# Patient Record
Sex: Female | Born: 1935 | Race: White | Hispanic: No | Smoking: Never smoker
Health system: Southern US, Community
[De-identification: ages and names within clinical notes are randomized; demographics above are authoritative.]

## PROBLEM LIST (undated history)

## (undated) DIAGNOSIS — H409 Unspecified glaucoma: Secondary | ICD-10-CM

## (undated) DIAGNOSIS — I509 Heart failure, unspecified: Secondary | ICD-10-CM

## (undated) DIAGNOSIS — K219 Gastro-esophageal reflux disease without esophagitis: Secondary | ICD-10-CM

## (undated) DIAGNOSIS — E559 Vitamin D deficiency, unspecified: Secondary | ICD-10-CM

## (undated) DIAGNOSIS — D649 Anemia, unspecified: Secondary | ICD-10-CM

## (undated) DIAGNOSIS — G4700 Insomnia, unspecified: Secondary | ICD-10-CM

## (undated) HISTORY — DX: Heart failure, unspecified: I50.9

## (undated) HISTORY — DX: Unspecified glaucoma: H40.9

## (undated) HISTORY — DX: Insomnia, unspecified: G47.00

## (undated) HISTORY — DX: Anemia, unspecified: D64.9

## (undated) HISTORY — DX: Gastro-esophageal reflux disease without esophagitis: K21.9

## (undated) HISTORY — DX: Vitamin D deficiency, unspecified: E55.9

---

## 2013-07-13 ENCOUNTER — Encounter (HOSPITAL_COMMUNITY): Payer: Self-pay | Admitting: Emergency Medicine

## 2013-07-13 ENCOUNTER — Observation Stay (HOSPITAL_COMMUNITY)
Admission: EM | Admit: 2013-07-13 | Discharge: 2013-07-14 | Disposition: A | Discharge status: Home / Self Care | Payer: Medicare Other | Attending: Internal Medicine | Admitting: Internal Medicine

## 2013-07-13 ENCOUNTER — Emergency Department (HOSPITAL_COMMUNITY): Payer: Medicare Other

## 2013-07-13 DIAGNOSIS — K219 Gastro-esophageal reflux disease without esophagitis: Secondary | ICD-10-CM

## 2013-07-13 DIAGNOSIS — I4891 Unspecified atrial fibrillation: Secondary | ICD-10-CM | POA: Insufficient documentation

## 2013-07-13 DIAGNOSIS — H9190 Unspecified hearing loss, unspecified ear: Secondary | ICD-10-CM | POA: Insufficient documentation

## 2013-07-13 DIAGNOSIS — E871 Hypo-osmolality and hyponatremia: Secondary | ICD-10-CM | POA: Insufficient documentation

## 2013-07-13 DIAGNOSIS — Z23 Encounter for immunization: Secondary | ICD-10-CM | POA: Insufficient documentation

## 2013-07-13 DIAGNOSIS — Z79899 Other long term (current) drug therapy: Secondary | ICD-10-CM | POA: Insufficient documentation

## 2013-07-13 DIAGNOSIS — R079 Chest pain, unspecified: Secondary | ICD-10-CM

## 2013-07-13 DIAGNOSIS — R0789 Other chest pain: Principal | ICD-10-CM | POA: Insufficient documentation

## 2013-07-13 LAB — CBC WITH DIFFERENTIAL/PLATELET
Basophils Absolute: 0 10*3/uL (ref 0.0–0.1)
HCT: 31.6 % — ABNORMAL LOW (ref 36.0–46.0)
Hemoglobin: 11.2 g/dL — ABNORMAL LOW (ref 12.0–15.0)
Lymphocytes Relative: 27 % (ref 12–46)
MCHC: 35.4 g/dL (ref 30.0–36.0)
Monocytes Absolute: 0.8 10*3/uL (ref 0.1–1.0)
Monocytes Relative: 17 % — ABNORMAL HIGH (ref 3–12)
Neutro Abs: 2.5 10*3/uL (ref 1.7–7.7)
Neutrophils Relative %: 53 % (ref 43–77)
Platelets: 145 10*3/uL — ABNORMAL LOW (ref 150–400)
RDW: 12.3 % (ref 11.5–15.5)
WBC: 4.7 10*3/uL (ref 4.0–10.5)

## 2013-07-13 LAB — BASIC METABOLIC PANEL
CO2: 25 mEq/L (ref 19–32)
Chloride: 95 mEq/L — ABNORMAL LOW (ref 96–112)
Creatinine, Ser: 0.51 mg/dL (ref 0.50–1.10)
GFR calc Af Amer: >90 mL/min (ref >90)
Potassium: 3.7 mEq/L (ref 3.5–5.1)

## 2013-07-13 LAB — CBC
HCT: 29.7 % — ABNORMAL LOW (ref 36.0–46.0)
Hemoglobin: 10.4 g/dL — ABNORMAL LOW (ref 12.0–15.0)
MCHC: 35 g/dL (ref 30.0–36.0)
MCV: 92.5 fL (ref 78.0–100.0)
Platelets: 146 10*3/uL — ABNORMAL LOW (ref 150–400)
RDW: 12.5 % (ref 11.5–15.5)
WBC: 3.1 10*3/uL — ABNORMAL LOW (ref 4.0–10.5)

## 2013-07-13 LAB — CREATININE, SERUM: GFR calc Af Amer: 84 mL/min — ABNORMAL LOW (ref >90)

## 2013-07-13 LAB — TROPONIN I: Troponin I: <0.3 ng/mL (ref <0.30)

## 2013-07-13 LAB — DIGOXIN LEVEL: Digoxin Level: 0.3 ng/mL — ABNORMAL LOW (ref 0.8–2.0)

## 2013-07-13 LAB — POCT I-STAT TROPONIN I

## 2013-07-13 MED ORDER — SODIUM CHLORIDE 0.9 % IV SOLN
INTRAVENOUS | Status: DC
  Administered 2013-07-13 – 2013-07-14 (×2): via INTRAVENOUS

## 2013-07-13 MED ORDER — CARVEDILOL 6.25 MG PO TABS
6.2500 mg | ORAL_TABLET | Freq: Two times a day (BID) | ORAL | Status: DC
  Administered 2013-07-13 – 2013-07-14 (×2): 6.25 mg via ORAL
  Filled 2013-07-13 (×4): qty 1

## 2013-07-13 MED ORDER — ASPIRIN 325 MG PO TABS
325.0000 mg | ORAL_TABLET | Freq: Every day | ORAL | Status: DC
  Administered 2013-07-13 – 2013-07-14 (×2): 325 mg via ORAL
  Filled 2013-07-13 (×2): qty 1

## 2013-07-13 MED ORDER — ALUM & MAG HYDROXIDE-SIMETH 200-200-20 MG/5ML PO SUSP: 30.0000 mL | Freq: Four times a day (QID) | ORAL | Status: DC | PRN

## 2013-07-13 MED ORDER — NITROGLYCERIN 0.4 MG SL SUBL: 0.4000 mg | SUBLINGUAL_TABLET | SUBLINGUAL | Status: DC | PRN

## 2013-07-13 MED ORDER — LATANOPROST 0.005 % OP SOLN
1.0000 [drp] | Freq: Every day | OPHTHALMIC | Status: DC
  Administered 2013-07-13: 1 [drp] via OPHTHALMIC
  Filled 2013-07-13 (×2): qty 2.5

## 2013-07-13 MED ORDER — ENOXAPARIN SODIUM 30 MG/0.3ML ~~LOC~~ SOLN
30.0000 mg | SUBCUTANEOUS | Status: DC
  Administered 2013-07-13: 30 mg via SUBCUTANEOUS
  Filled 2013-07-13 (×2): qty 0.3

## 2013-07-13 MED ORDER — DIGOXIN 125 MCG PO TABS
0.1250 mg | ORAL_TABLET | Freq: Every day | ORAL | Status: DC
  Administered 2013-07-13 – 2013-07-14 (×2): 0.125 mg via ORAL
  Filled 2013-07-13 (×2): qty 1

## 2013-07-13 MED ORDER — INFLUENZA VAC SPLIT QUAD 0.5 ML IM SUSP
0.5000 mL | INTRAMUSCULAR | Status: AC
  Administered 2013-07-14: 0.5 mL via INTRAMUSCULAR
  Filled 2013-07-13: qty 0.5

## 2013-07-13 MED ORDER — ACETAMINOPHEN 325 MG PO TABS
650.0000 mg | ORAL_TABLET | Freq: Four times a day (QID) | ORAL | Status: DC | PRN
  Administered 2013-07-13: 650 mg via ORAL
  Filled 2013-07-13: qty 2

## 2013-07-13 MED ORDER — ONDANSETRON HCL 4 MG/2ML IJ SOLN: 4.0000 mg | Freq: Four times a day (QID) | INTRAMUSCULAR | Status: DC | PRN

## 2013-07-13 MED ORDER — BISACODYL 5 MG PO TBEC
5.0000 mg | DELAYED_RELEASE_TABLET | Freq: Every day | ORAL | Status: DC | PRN
  Filled 2013-07-13: qty 1

## 2013-07-13 MED ORDER — PANTOPRAZOLE SODIUM 40 MG PO TBEC
40.0000 mg | DELAYED_RELEASE_TABLET | Freq: Every day | ORAL | Status: DC
  Administered 2013-07-13 – 2013-07-14 (×2): 40 mg via ORAL
  Filled 2013-07-13 (×2): qty 1

## 2013-07-13 MED ORDER — SODIUM CHLORIDE 0.9 % IJ SOLN: 3.0000 mL | Freq: Two times a day (BID) | INTRAMUSCULAR | Status: DC

## 2013-07-13 MED ORDER — ACETAMINOPHEN 650 MG RE SUPP: 650.0000 mg | Freq: Four times a day (QID) | RECTAL | Status: DC | PRN

## 2013-07-13 MED ORDER — ONDANSETRON HCL 4 MG PO TABS: 4.0000 mg | ORAL_TABLET | Freq: Four times a day (QID) | ORAL | Status: DC | PRN

## 2013-07-13 MED ORDER — METOPROLOL TARTRATE 1 MG/ML IV SOLN
2.5000 mg | Freq: Once | INTRAVENOUS | Status: AC
  Administered 2013-07-13: 2.5 mg via INTRAVENOUS
  Filled 2013-07-13: qty 5

## 2013-07-13 NOTE — Progress Notes (Signed)
Pt converted to SR at 10:46.

## 2013-07-13 NOTE — ED Provider Notes (Signed)
CSN: [id number]     Arrival date & time 07/13/13  0247 History   First MD Initiated Contact with Patient 07/13/13 0255     Chief Complaint  Patient presents with  . Chest Pain   (Consider location/radiation/quality/duration/timing/severity/associated sxs/prior Treatment) HPI 79 yo female presents to the ER from home via EMS with complaint of chest pressure that woke her from sleep tonight.  Pt noted to have afib with RVR upon arrival, she and grandson deny h/o same.  Pt is very hard of hearing, difficult historian.  Pt lives on her own.  She has a cardiologist in [place], but does not know why she sees him.  No pain at present.  She denies palpitations despite current HR (130s)  She took an old NTG which seemed to help pain. EMS gave aspirin in route.  She felt better with NS flush per EMS.   History reviewed. No pertinent past medical history. History reviewed. No pertinent past surgical history. No family history on file. History  Substance Use Topics  . Smoking status: Not on file  . Smokeless tobacco: Not on file  . Alcohol Use: Not on file   OB History   Grav Para Term Preterm Abortions TAB SAB Ect Mult Living                 Review of Systems  Unable to perform ROS: Other  pt HOH  Allergies  Review of patient's allergies indicates no known allergies.  Home Medications   Current Outpatient Rx  Name  Route  Sig  Dispense  Refill  . carvedilol (COREG) 6.25 MG tablet   Oral   Take 6.25 mg by mouth 2 (two) times daily with a meal.         . digoxin (LANOXIN) 0.125 MG tablet   Oral   Take 0.125 mg by mouth daily.         Marland Kitchen loratadine (CLARITIN) 10 MG tablet   Oral   Take 10 mg by mouth daily.         . mirabegron ER (MYRBETRIQ) 25 MG TB24 tablet   Oral   Take 25 mg by mouth daily.         . nitroGLYCERIN (NITROSTAT) 0.4 MG SL tablet   Sublingual   Place 0.4 mg under the tongue every 5 (five) minutes as needed for chest pain.         Marland Kitchen OVER THE COUNTER  MEDICATION   Oral   Take 1 tablet by mouth daily. PreserVision         . pantoprazole (PROTONIX) 40 MG tablet   Oral   Take 40 mg by mouth daily.         . Travoprost, BAK Free, (TRAVATAN) 0.004 % SOLN ophthalmic solution   Both Eyes   Place 1 drop into both eyes at bedtime.          BP 98/58  Pulse 37  Temp(Src) 98.2 F (36.8 C) (Oral)  Resp 25  SpO2 95% Physical Exam  Constitutional: She is oriented to person, place, and time. No distress.  Frail elderly female in NAD  HENT:  Head: Normocephalic and atraumatic.  Nose: Nose normal.  Mouth/Throat: Oropharynx is clear and moist.  Eyes: Pupils are equal, round, and reactive to light.  Right pupil irregular  Neck: Normal range of motion. Neck supple. No JVD present. No tracheal deviation present. No thyromegaly present.  Cardiovascular:  Irregular rate, rhythm  Pulmonary/Chest: Effort normal and breath sounds  normal. No stridor. No respiratory distress. She has no wheezes. She has no rales. She exhibits no tenderness.  Abdominal: Soft. Bowel sounds are normal. She exhibits no distension and no mass. There is no tenderness. There is no rebound and no guarding.  Musculoskeletal: Normal range of motion. She exhibits no edema and no tenderness.  Lymphadenopathy:    She has no cervical adenopathy.  Neurological: She is alert and oriented to person, place, and time. She exhibits normal muscle tone. Coordination normal.  Skin: Skin is warm and dry. No rash noted. No erythema. No pallor.    ED Course  Procedures (including critical care time)  CRITICAL CARE Performed by: Olivia Mackie Total critical care time: 30 min Critical care time was exclusive of separately billable procedures and treating other patients. Critical care was necessary to treat or prevent imminent or life-threatening deterioration. Critical care was time spent personally by me on the following activities: development of treatment plan with patient and/or  surrogate as well as nursing, discussions with consultants, evaluation of patient's response to treatment, examination of patient, obtaining history from patient or surrogate, ordering and performing treatments and interventions, ordering and review of laboratory studies, ordering and review of radiographic studies, pulse oximetry and re-evaluation of patient's condition.  Labs Review Labs Reviewed  CBC WITH DIFFERENTIAL - Abnormal; Notable for the following:    RBC 3.41 (*)    Hemoglobin 11.2 (*)    HCT 31.6 (*)    Platelets 145 (*)    Monocytes Relative 17 (*)    All other components within normal limits  BASIC METABOLIC PANEL - Abnormal; Notable for the following:    Sodium 127 (*)    Chloride 95 (*)    Glucose, Bld 105 (*)    GFR calc non Af Amer 78 (*)    All other components within normal limits  DIGOXIN LEVEL - Abnormal; Notable for the following:    Digoxin Level 0.3 (*)    All other components within normal limits   Imaging Review Dg Chest 2 View  07/13/2013   CLINICAL DATA:  Chest pain  EXAM: CHEST  2 VIEW  COMPARISON:  08/21/2012  FINDINGS: Chronic cardiomegaly. Mediastinal contours distorted by leftward rotation. Hyperinflated lungs with chronic interstitial coarsening. Streaky lower lung opacities, especially at the right base, appears similar to prior. Left costophrenic sulcus blunting is likely chronic. No suspected pulmonary edema.  IMPRESSION: No definite acute cardiopulmonary disease. There are chronic changes which could obscure early infection.   Electronically Signed   By: Tiburcio Pea M.D.   On: 07/13/2013 03:36    EKG Interpretation     Ventricular Rate:  128 PR Interval:    QRS Duration: 87 QT Interval:  339 QTC Calculation: 495 R Axis:   -21 Text Interpretation:  Atrial fibrillation Multiple ventricular premature complexes Borderline left axis deviation Abnormal R-wave progression, early transition Borderline repolarization abnormality No old tracing to  compare            MDM   1. Atrial fibrillation with RVR   2. Chest pain   3. Hyponatremia    79 yo female with chest pain tonight, in afib with rvr currently.  No prior records in our system.  Records requested from [facility], ED visit from 12/13 sent.  No mention of afib, cardiac history.  Sodium on that visit 130.  D/w hospitalist for admission for chest pain.  Afib improved from 130s to 100s with metoprolol.    Olivia Mackie,  MD 07/13/13 1610

## 2013-07-13 NOTE — H&P (Signed)
[facility] History and Physical  DAMARIZ PAGANELLI UJW:[id number] DOB: [birth year] DOA: 07/13/2013  Referring physician:  PCP: No primary provider on file.  Specialists:   Chief Complaint:   HPI: Tanya Russell is a 79 y.o. female with a past medical history of atrial fibrillation, presently on digoxin 0.125 mg by mouth daily and Coreg 6.25 mg by mouth daily, who was in her usual state health, went to bed last night at approximately 10 PM, will go up at 1:30 having palpitations, feeling "heart racing" with associated dizziness and lightheadedness. Because the symptoms persisted she called EMS and was brought to the emergent apartment where she was found to be in A. fib with rapid ventricular response having ventricular rates in the 130s. She was given 2.5 mg IV of metoprolol with subsequent improvement to her heart rates. Since then she has been rate controlled with ventricular rates in the 80s. Patient denies having chest pain however I spoke to her grandson who informed me that she might have complained of chest discomfort overnight while this was happening. Otherwise patient reports feeling much better and back to her "normal self." I discussed case with her cardiologist Dr Bing Matter who recommended continuing her present home regimen of digoxin and carvedilol, adding aspirin 325 mg by mouth daily for stroke prophylaxis.                                            Review of Systems: The patient denies anorexia, fever, weight loss,, vision loss, decreased hearing, hoarseness, syncope, dyspnea on exertion, peripheral edema, balance deficits, hemoptysis, abdominal pain, melena, hematochezia, severe indigestion/heartburn, hematuria, incontinence, genital sores, muscle weakness, suspicious skin lesions, transient blindness, difficulty walking, depression, unusual weight change, abnormal bleeding, enlarged lymph nodes, angioedema, and breast masses.    History reviewed. No pertinent past medical  history. History reviewed. No pertinent past surgical history. Social History:  has no tobacco, alcohol, and drug history on file. Patient currently resides independently in the community. She does not drink or smoke. At baseline she is independent on all activities of daily living  No Known Allergies  No family history on file. noncontributory  Prior to Admission medications   Medication Sig Start Date End Date Taking? Authorizing Provider  carvedilol (COREG) 6.25 MG tablet Take 6.25 mg by mouth 2 (two) times daily with a meal.   Yes Historical Provider, MD  digoxin (LANOXIN) 0.125 MG tablet Take 0.125 mg by mouth daily.   Yes Historical Provider, MD  loratadine (CLARITIN) 10 MG tablet Take 10 mg by mouth daily.   Yes Historical Provider, MD  mirabegron ER (MYRBETRIQ) 25 MG TB24 tablet Take 25 mg by mouth daily.   Yes Historical Provider, MD  nitroGLYCERIN (NITROSTAT) 0.4 MG SL tablet Place 0.4 mg under the tongue every 5 (five) minutes as needed for chest pain.   Yes Historical Provider, MD  OVER THE COUNTER MEDICATION Take 1 tablet by mouth daily. PreserVision   Yes Historical Provider, MD  pantoprazole (PROTONIX) 40 MG tablet Take 40 mg by mouth daily.   Yes Historical Provider, MD  Travoprost, BAK Free, (TRAVATAN) 0.004 % SOLN ophthalmic solution Place 1 drop into both eyes at bedtime.   Yes Historical Provider, MD   Physical Exam: Filed Vitals:   07/13/13 0835  BP: 116/52  Pulse: 89  Temp: 97.5 F (36.4 C)  Resp: 15  General:  Patient is in no acute distress, reports feeling better, requesting a meal tray  Eyes: Pupils are equal round reactive to light extraocular movement is intact no sclera icterus  Neck: Neck supple symmetrical no jugular venous distention or carotid bruit  Cardiovascular: Irregular rate and rhythm normal S1-S2 no murmurs rubs or gallops no extremity edema  Respiratory: Lungs overall clear to auscultation bilaterally no wheezing rhonchi or  rales  Abdomen: Soft nontender nondistended positive bowel sounds  Skin: No rashes or lesions  Musculoskeletal: No edema noted, preserved range of motion of all extremities  Psychiatric: Patient is awake alert oriented x3, hard hearing  Neurologic: Cranial nerves 2-12 are grossly intact no alteration to sensation global 5 out of 5 muscle strength  Labs on Admission:  Basic Metabolic Panel:  Recent Labs Lab 07/13/13 0342  NA 127*  K 3.7  CL 95*  CO2 25  GLUCOSE 105*  BUN 14  CREATININE 0.51  CALCIUM 8.5   Liver Function Tests: No results found for this basename: AST, ALT, ALKPHOS, BILITOT, PROT, ALBUMIN,  in the last 168 hours No results found for this basename: LIPASE, AMYLASE,  in the last 168 hours No results found for this basename: AMMONIA,  in the last 168 hours CBC:  Recent Labs Lab 07/13/13 0342  WBC 4.7  NEUTROABS 2.5  HGB 11.2*  HCT 31.6*  MCV 92.7  PLT 145*   Cardiac Enzymes: No results found for this basename: CKTOTAL, CKMB, CKMBINDEX, TROPONINI,  in the last 168 hours  BNP (last 3 results) No results found for this basename: PROBNP,  in the last 8760 hours CBG: No results found for this basename: GLUCAP,  in the last 168 hours  Radiological Exams on Admission: Dg Chest 2 View  07/13/2013   CLINICAL DATA:  Chest pain  EXAM: CHEST  2 VIEW  COMPARISON:  08/21/2012  FINDINGS: Chronic cardiomegaly. Mediastinal contours distorted by leftward rotation. Hyperinflated lungs with chronic interstitial coarsening. Streaky lower lung opacities, especially at the right base, appears similar to prior. Left costophrenic sulcus blunting is likely chronic. No suspected pulmonary edema.  IMPRESSION: No definite acute cardiopulmonary disease. There are chronic changes which could obscure early infection.   Electronically Signed   By: Tiburcio Pea M.D.   On: 07/13/2013 03:36    EKG: Independently reviewed. Atrial fibrillation  Assessment/Plan Active Problems:    Atrial fibrillation with rapid ventricular response   Chest pain, unspecified   Hyponatremia   GERD (gastroesophageal reflux disease)   1. Atrial fibrillation with rapid ventricular response. Patient presenting with a fair with RVR, having ventricular rates in the 130s. After the administration of metoprolol 2.5 mg IV, ventricular rate improved into the 80s. I discussed case with her cardiologist Dr Bing Matter, who recommended continuing the current dose of digoxin at 0.25 mcg by mouth daily and Coreg 6.25 mg by mouth twice daily. He also recommended starting aspirin 325 mg by mouth daily. Patient had a transthoracic echocardiogram done in his office in 2013 which showed a preserved ejection fraction . Will admit patient to August, placed on continuous cardiac monitoring and cycle cardiac enzymes.  2. Chest pain. Although patient reports having chest pain, grandson feels that she may have complained of chest pain overnight. This likely resulted from A. fib with RVR. Will monitor her cardiac enzymes today. 3. Hyponatremia. Lab work showing a sodium of 127. Could be secondary to hypovolemia, will start gentle IV fluid hydration with normal saline at 75 ML's per hour. Repeat  BMP in a.m. 4. Gastroesophageal reflux disease. Protonix 40 mg by mouth daily   Code Status: Spoke with patient's grandson, patient is a DO NOT RESUSCITATE Family Communication:  I updated patient's grandson Molly Maduro over telephone Disposition Plan: Placing patient in overnight OBS DO not anticipate greater than 2 night hospitalization    Time spent: 76  Meah Jiron [facility] Pager [phone]  If 7PM-7AM, please contact night-coverage [email or web address] Password [email or web address] 07/13/2013, 11:01 AM

## 2013-07-13 NOTE — Progress Notes (Signed)
Utilization review completed.  

## 2013-07-13 NOTE — ED Notes (Signed)
PER EMS: pt from home, reports non-radiating CP "it feels like my heart is not beating right." Took nitro "that ive had forever" prior to EMS arrival. EMS reports pt was in A-fib when they arrived. EMS 18g RAC, flushed with 10cc normal saline and pt states "that shot you gave me made me feel better." Pt denies pain at this moment. BP- 112/64, Hr-102 Irregular, RR-19, 95% RA. Pt very heard of hearing.

## 2013-07-14 DIAGNOSIS — R079 Chest pain, unspecified: Secondary | ICD-10-CM

## 2013-07-14 DIAGNOSIS — I4891 Unspecified atrial fibrillation: Secondary | ICD-10-CM

## 2013-07-14 LAB — BASIC METABOLIC PANEL
CO2: 26 mEq/L (ref 19–32)
Chloride: 104 mEq/L (ref 96–112)
GFR calc non Af Amer: 79 mL/min — ABNORMAL LOW (ref >90)
Glucose, Bld: 83 mg/dL (ref 70–99)
Potassium: 3.4 mEq/L — ABNORMAL LOW (ref 3.5–5.1)
Sodium: 135 mEq/L (ref 135–145)

## 2013-07-14 LAB — CBC
HCT: 28.3 % — ABNORMAL LOW (ref 36.0–46.0)
Hemoglobin: 10.2 g/dL — ABNORMAL LOW (ref 12.0–15.0)
MCH: 33.3 pg (ref 26.0–34.0)
MCV: 92.5 fL (ref 78.0–100.0)
RBC: 3.06 MIL/uL — ABNORMAL LOW (ref 3.87–5.11)

## 2013-07-14 MED ORDER — POTASSIUM CHLORIDE CRYS ER 20 MEQ PO TBCR
40.0000 meq | EXTENDED_RELEASE_TABLET | Freq: Once | ORAL | Status: AC
  Administered 2013-07-14: 40 meq via ORAL
  Filled 2013-07-14: qty 2

## 2013-07-14 MED ORDER — ASPIRIN 325 MG PO TABS: 325.0000 mg | ORAL_TABLET | Freq: Every day | ORAL | Status: AC

## 2013-07-14 NOTE — Progress Notes (Signed)
Pt grandson provided with dc instructions and education. No concerns at this time. IV removed with tip intact. Heart monitor cleaned and returned to frotn. Pt dc home with grandson. Levonne Spiller, RN

## 2013-07-14 NOTE — Evaluation (Addendum)
Physical Therapy Evaluation Patient Details Name: Tanya Russell MRN: [id number] DOB: Jan 17, 1914 Today's Date: 07/14/2013 Time: 9629-5284 PT Time Calculation (min): 24 min  PT Assessment / Plan / Recommendation History of Present Illness  Pt adm with a-fib with RVR.  Clinical Impression  Pt presents to PT with fall risk with ambulation but has been managing at home.  Cluttered home environment makes use of rolling walker impractical. Pt for dc home and recommend HHPT to further assess home safety and mobility.     PT Assessment  All further PT needs can be met in the next venue of care    Follow Up Recommendations  Home health PT    Does the patient have the potential to tolerate intense rehabilitation      Barriers to Discharge        Equipment Recommendations  None recommended by PT    Recommendations for Other Services     Frequency      Precautions / Restrictions Precautions Precautions: Fall   Pertinent Vitals/Pain VSS      Mobility  Transfers Transfers: Sit to Stand;Stand to Sit Sit to Stand: 6: Modified independent (Device/Increase time);With upper extremity assist;From bed;From toilet Stand to Sit: 6: Modified independent (Device/Increase time);With upper extremity assist;To bed;To toilet Ambulation/Gait Ambulation/Gait Assistance: 5: Supervision Ambulation Distance (Feet): 100 Feet Assistive device: Large base quad cane;Rolling walker Ambulation/Gait Assistance Details: When using quad cane pt reaching and holding onto objects with other hand.  Pt steadier with rolling walker but unable to use in house due to clutter. Gait Pattern: Step-through pattern;Decreased step length - right;Decreased step length - left;Trunk flexed Gait velocity: slow    Exercises     PT Diagnosis: Difficulty walking  PT Problem List: Decreased balance;Decreased mobility;Decreased knowledge of use of DME PT Treatment Interventions:       PT Goals(Current goals can be  found in the care plan section) Acute Rehab PT Goals Patient Stated Goal: Go home PT Goal Formulation: No goals set, d/c therapy  Visit Information  Last PT Received On: 07/14/13 Assistance Needed: +1 History of Present Illness: Pt adm with a-fib with RVR.       Prior Functioning  Home Living Family/patient expects to be discharged to:: Private residence Living Arrangements: Alone Available Help at Discharge: Family;Available PRN/intermittently Home Equipment: Cane - quad Additional Comments: Unable to get more details due to [facility]. Prior Function Level of Independence: Independent with assistive device(s) Comments: Uses quad cane Communication Communication: HOH    Cognition  Cognition Arousal/Alertness: Awake/alert Behavior During Therapy: WFL for tasks assessed/performed Overall Cognitive Status: Difficult to assess Difficult to assess due to: Hard of hearing/deaf    Extremity/Trunk Assessment Upper Extremity Assessment Upper Extremity Assessment: Overall WFL for tasks assessed Lower Extremity Assessment Lower Extremity Assessment: Overall WFL for tasks assessed   Balance Balance Balance Assessed: Yes Static Standing Balance Static Standing - Balance Support: No upper extremity supported;During functional activity Static Standing - Level of Assistance: 6: Modified independent (Device/Increase time)  End of Session PT - End of Session Activity Tolerance: Patient tolerated treatment well Patient left: in bed;with call bell/phone within reach Nurse Communication: Mobility status  GP Functional Assessment Tool Used: clinical judgement Functional Limitation: Mobility: Walking and moving around Mobility: Walking and Moving Around Current Status ([id number]): At least 1 percent but less than 20 percent impaired, limited or restricted Mobility: Walking and Moving Around Goal Status [phone]): At least 1 percent but less than 20 percent impaired, limited or restricted Mobility:  Walking  and Moving Around Discharge Status [phone]): At least 1 percent but less than 20 percent impaired, limited or restricted   [facility] 07/14/2013, 2:45 PM  [facility] PT [phone]

## 2013-07-14 NOTE — Care Management Note (Signed)
   CARE MANAGEMENT NOTE 07/14/2013  Patient:  Tanya Russell, Tanya Russell   Account Number:  [id number]  Date Initiated:  07/14/2013  Documentation initiated by:  Sibyl Mikula  Subjective/Objective Assessment:   Orders for [facility] and HHPT     Action/Plan:   Met with pt and grandson re HH needs, per grandson, pt most likely will refuse HH services however she has agreed to a [facility] so arrangements have been made with [facility] all info faxed.   Anticipated DC Date:  07/14/2013   Anticipated DC Plan:  HOME W HOME HEALTH SERVICES         Choice offered to / List presented to:          [facility] arranged  HH-1 RN  HH-2 PT      [facility] agency  [facility] HEALTH   Status of service:  Completed, signed off Medicare Important Message given?   (If response is "NO", the following Medicare IM given date fields will be blank) Date Medicare IM given:   Date Additional Medicare IM given:    Discharge Disposition:  HOME W HOME HEALTH SERVICES  Per UR Regulation:    If discussed at Long Length of Stay Meetings, dates discussed:    Comments:

## 2013-07-14 NOTE — Discharge Summary (Signed)
Physician Discharge Summary  Tanya Russell:[id number] DOB: [birth year] DOA: 07/13/2013  PCP: No primary provider on file.  Admit date: 07/13/2013 Discharge date: 07/14/2013  Time spent: 35 minutes  Recommendations for Outpatient Follow-up:  1. Follow up with primary cardiologist for further adjustment of medications.   Discharge Diagnoses:    Atrial fibrillation with rapid ventricular response   Chest pain, unspecified   Hyponatremia   GERD (gastroesophageal reflux disease)   Discharge Condition: stable.   Diet recommendation: Heart healthy  Filed Weights   07/13/13 0835 07/14/13 0500  Weight: 52.663 kg (116 lb 1.6 oz) 52.456 kg (115 lb 10.3 oz)    History of present illness:  Tanya Russell is a 79 y.o. female with a past medical history of atrial fibrillation, presently on digoxin 0.125 mg by mouth daily and Coreg 6.25 mg by mouth daily, who was in her usual state health, went to bed last night at approximately 10 PM, will go up at 1:30 having palpitations, feeling "heart racing" with associated dizziness and lightheadedness. Because the symptoms persisted she called EMS and was brought to the emergent apartment where she was found to be in A. fib with rapid ventricular response having ventricular rates in the 130s. She was given 2.5 mg IV of metoprolol with subsequent improvement to her heart rates. Since then she has been rate controlled with ventricular rates in the 80s. Patient denies having chest pain however I spoke to her grandson who informed me that she might have complained of chest discomfort overnight while this was happening. Otherwise patient reports feeling much better and back to her "normal self." I discussed case with her cardiologist Dr Bing Matter who recommended continuing her present home regimen of digoxin and carvedilol, adding aspirin 325 mg by mouth daily for stroke prophylaxis.    Hospital Course:   1-Atrial fibrillation with rapid ventricular  response; Patient presenting with a fair with RVR, having ventricular rates in the 130s. After the administration of metoprolol 2.5 mg IV, ventricular rate improved into the 80s. Admitting  physician discussed case with her cardiologist Dr Bing Matter, who recommended continuing the current dose of digoxin  daily and Coreg 6.25 mg by mouth twice daily. Per grandson, patient has been taking digoxin every 4 days as instructed by her cardiologist. Digoxin level low on admission. HR rate controlled. Plan to discharge patient today and to follow up with her cardiologist. Lucila Maine aware.   2-Chest pain; in setting of Afib RVR. Resolved. Troponin times 3 negative. Needs to follow up with primary cardiologist.  3-Hyponatremia; Resolved with IV fluids. sodium on admission at 127, increase to 135 today.  4-Gastroesophageal reflux disese. Protonix 40 mg by mouth daily 5-Hypokalemia: replaced.  6-Anemia: needs to follow up with PCP.   Procedures:  none  Consultations:  none  Discharge Exam: Filed Vitals:   07/14/13 0328  BP: 119/48  Pulse: 67  Temp: 97.7 F (36.5 C)  Resp: 18    General: no distress Cardiovascular: S 1, S 2 RRR Respiratory: CTA  Discharge Instructions  Discharge Orders   Future Orders Complete By Expires   Diet - low sodium heart healthy  As directed    Increase activity slowly  As directed        Medication List         aspirin 325 MG tablet  Take 1 tablet (325 mg total) by mouth daily.     carvedilol 6.25 MG tablet  Commonly known as:  COREG  Take 6.25 mg by  mouth 2 (two) times daily with a meal.     digoxin 0.125 MG tablet  Commonly known as:  LANOXIN  Take 0.125 mg by mouth daily.     loratadine 10 MG tablet  Commonly known as:  CLARITIN  Take 10 mg by mouth daily.     MYRBETRIQ 25 MG Tb24 tablet  Generic drug:  mirabegron ER  Take 25 mg by mouth daily.     nitroGLYCERIN 0.4 MG SL tablet  Commonly known as:  NITROSTAT  Place 0.4 mg under the  tongue every 5 (five) minutes as needed for chest pain.     OVER THE COUNTER MEDICATION  Take 1 tablet by mouth daily. PreserVision     pantoprazole 40 MG tablet  Commonly known as:  PROTONIX  Take 40 mg by mouth daily.     Travoprost (BAK Free) 0.004 % Soln ophthalmic solution  Commonly known as:  TRAVATAN  Place 1 drop into both eyes at bedtime.       No Known Allergies    The results of significant diagnostics from this hospitalization (including imaging, microbiology, ancillary and laboratory) are listed below for reference.    Significant Diagnostic Studies: Dg Chest 2 View  07/13/2013   CLINICAL DATA:  Chest pain  EXAM: CHEST  2 VIEW  COMPARISON:  08/21/2012  FINDINGS: Chronic cardiomegaly. Mediastinal contours distorted by leftward rotation. Hyperinflated lungs with chronic interstitial coarsening. Streaky lower lung opacities, especially at the right base, appears similar to prior. Left costophrenic sulcus blunting is likely chronic. No suspected pulmonary edema.  IMPRESSION: No definite acute cardiopulmonary disease. There are chronic changes which could obscure early infection.   Electronically Signed   By: Tiburcio Pea M.D.   On: 07/13/2013 03:36    Microbiology: No results found for this or any previous visit (from the past 240 hour(s)).   Labs: Basic Metabolic Panel:  Recent Labs Lab 07/13/13 0342 07/13/13 1208 07/14/13 0555  NA 127*  --  135  K 3.7  --  3.4*  CL 95*  --  104  CO2 25  --  26  GLUCOSE 105*  --  83  BUN 14  --  9  CREATININE 0.51 0.65 0.50  CALCIUM 8.5  --  8.3*   Liver Function Tests: No results found for this basename: AST, ALT, ALKPHOS, BILITOT, PROT, ALBUMIN,  in the last 168 hours No results found for this basename: LIPASE, AMYLASE,  in the last 168 hours No results found for this basename: AMMONIA,  in the last 168 hours CBC:  Recent Labs Lab 07/13/13 0342 07/13/13 1208 07/14/13 0555  WBC 4.7 3.1* 2.6*  NEUTROABS 2.5  --    --   HGB 11.2* 10.4* 10.2*  HCT 31.6* 29.7* 28.3*  MCV 92.7 92.5 92.5  PLT 145* 146* 121*   Cardiac Enzymes:  Recent Labs Lab 07/13/13 1208 07/13/13 1625 07/13/13 2230  TROPONINI <0.30 <0.30 <0.30   BNP: BNP (last 3 results) No results found for this basename: PROBNP,  in the last 8760 hours CBG: No results found for this basename: GLUCAP,  in the last 168 hours     Signed:  Maleeyah Mccaughey  [facility] 07/14/2013, 11:11 AM

## 2015-10-11 ENCOUNTER — Emergency Department (HOSPITAL_COMMUNITY): Payer: PPO

## 2015-10-11 ENCOUNTER — Emergency Department (HOSPITAL_COMMUNITY)
Admission: EM | Admit: 2015-10-11 | Discharge: 2015-10-11 | Disposition: A | Discharge status: Home / Self Care | Payer: PPO | Attending: Emergency Medicine | Admitting: Emergency Medicine

## 2015-10-11 ENCOUNTER — Encounter (HOSPITAL_COMMUNITY): Payer: Self-pay

## 2015-10-11 DIAGNOSIS — S0990XA Unspecified injury of head, initial encounter: Secondary | ICD-10-CM

## 2015-10-11 DIAGNOSIS — Y998 Other external cause status: Secondary | ICD-10-CM | POA: Insufficient documentation

## 2015-10-11 DIAGNOSIS — S161XXA Strain of muscle, fascia and tendon at neck level, initial encounter: Secondary | ICD-10-CM | POA: Diagnosis not present

## 2015-10-11 DIAGNOSIS — W06XXXA Fall from bed, initial encounter: Secondary | ICD-10-CM | POA: Diagnosis not present

## 2015-10-11 DIAGNOSIS — Z79899 Other long term (current) drug therapy: Secondary | ICD-10-CM | POA: Insufficient documentation

## 2015-10-11 DIAGNOSIS — Z7982 Long term (current) use of aspirin: Secondary | ICD-10-CM | POA: Diagnosis not present

## 2015-10-11 DIAGNOSIS — S0181XA Laceration without foreign body of other part of head, initial encounter: Secondary | ICD-10-CM

## 2015-10-11 DIAGNOSIS — W19XXXA Unspecified fall, initial encounter: Secondary | ICD-10-CM

## 2015-10-11 DIAGNOSIS — F0280 Dementia in other diseases classified elsewhere without behavioral disturbance: Secondary | ICD-10-CM | POA: Diagnosis not present

## 2015-10-11 DIAGNOSIS — G309 Alzheimer's disease, unspecified: Secondary | ICD-10-CM | POA: Diagnosis not present

## 2015-10-11 DIAGNOSIS — Y92129 Unspecified place in nursing home as the place of occurrence of the external cause: Secondary | ICD-10-CM | POA: Diagnosis not present

## 2015-10-11 DIAGNOSIS — Y9389 Activity, other specified: Secondary | ICD-10-CM | POA: Diagnosis not present

## 2015-10-11 MED ORDER — LIDOCAINE-EPINEPHRINE (PF) 2 %-1:200000 IJ SOLN
10.0000 mL | Freq: Once | INTRAMUSCULAR | Status: AC
  Administered 2015-10-11: 10 mL via INTRADERMAL

## 2015-10-11 MED ORDER — LIDOCAINE-EPINEPHRINE 2 %-1:100000 IJ SOLN
INTRAMUSCULAR | Status: AC
  Filled 2015-10-11: qty 1

## 2015-10-11 NOTE — ED Notes (Signed)
Pt had an unwitnessed fall from the bed at the facility tonight, she has a laceration to her forehead, bleeding controlled at this time

## 2015-10-11 NOTE — ED Notes (Signed)
Report called to facility and ambulance called for transportation

## 2015-10-11 NOTE — Discharge Instructions (Signed)
Local wound care with bacitracin and dressing changes twice daily.  Sutures are to be removed in 5-7 days.  Return to the ER sooner if you develop redness, pus draining from the wound, or other new and concerning symptoms.   Facial Laceration  A facial laceration is a cut on the face. These injuries can be painful and cause bleeding. Lacerations usually heal quickly, but they need special care to reduce scarring. DIAGNOSIS  Your health care provider will take a medical history, ask for details about how the injury occurred, and examine the wound to determine how deep the cut is. TREATMENT  Some facial lacerations may not require closure. Others may not be able to be closed because of an increased risk of infection. The risk of infection and the chance for successful closure will depend on various factors, including the amount of time since the injury occurred. The wound may be cleaned to help prevent infection. If closure is appropriate, pain medicines may be given if needed. Your health care provider will use stitches (sutures), wound glue (adhesive), or skin adhesive strips to repair the laceration. These tools bring the skin edges together to allow for faster healing and a better cosmetic outcome. If needed, you may also be given a tetanus shot. HOME CARE INSTRUCTIONS  Only take over-the-counter or prescription medicines as directed by your health care provider.  Follow your health care provider's instructions for wound care. These instructions will vary depending on the technique used for closing the wound. For Sutures:  Keep the wound clean and dry.   If you were given a bandage (dressing), you should change it at least once a day. Also change the dressing if it becomes wet or dirty, or as directed by your health care provider.   Wash the wound with soap and water 2 times a day. Rinse the wound off with water to remove all soap. Pat the wound dry with a clean towel.   After  cleaning, apply a thin layer of the antibiotic ointment recommended by your health care provider. This will help prevent infection and keep the dressing from sticking.   You may shower as usual after the first 24 hours. Do not soak the wound in water until the sutures are removed.   Get your sutures removed as directed by your health care provider. With facial lacerations, sutures should usually be taken out after 4-5 days to avoid stitch marks.   Wait a few days after your sutures are removed before applying any makeup. For Skin Adhesive Strips:  Keep the wound clean and dry.   Do not get the skin adhesive strips wet. You may bathe carefully, using caution to keep the wound dry.   If the wound gets wet, pat it dry with a clean towel.   Skin adhesive strips will fall off on their own. You may trim the strips as the wound heals. Do not remove skin adhesive strips that are still stuck to the wound. They will fall off in time.  For Wound Adhesive:  You may briefly wet your wound in the shower or bath. Do not soak or scrub the wound. Do not swim. Avoid periods of heavy sweating until the skin adhesive has fallen off on its own. After showering or bathing, gently pat the wound dry with a clean towel.   Do not apply liquid medicine, cream medicine, ointment medicine, or makeup to your wound while the skin adhesive is in place. This may loosen the film before  your wound is healed.   If a dressing is placed over the wound, be careful not to apply tape directly over the skin adhesive. This may cause the adhesive to be pulled off before the wound is healed.   Avoid prolonged exposure to sunlight or tanning lamps while the skin adhesive is in place.  The skin adhesive will usually remain in place for 5-10 days, then naturally fall off the skin. Do not pick at the adhesive film.  After Healing: Once the wound has healed, cover the wound with sunscreen during the day for 1 full year. This  can help minimize scarring. Exposure to ultraviolet light in the first year will darken the scar. It can take 1-2 years for the scar to lose its redness and to heal completely.  SEEK MEDICAL CARE IF:  You have a fever. SEEK IMMEDIATE MEDICAL CARE IF:  You have redness, pain, or swelling around the wound.   You see ayellowish-white fluid (pus) coming from the wound.    This information is not intended to replace advice given to you by your health care provider. Make sure you discuss any questions you have with your health care provider.   Document Released: 10/10/2004 Document Revised: 09/23/2014 Document Reviewed: 04/15/2013 Elsevier Interactive Patient Education 2016 [employer].  Fall Prevention in Hospitals, Adult As a hospital patient, your condition and the treatments you receive can increase your risk for falls. Some additional risk factors for falls in a hospital include:  Being in an unfamiliar environment.  Being on bed rest.  Your surgery.  Taking certain medicines.  Your tubing requirements, such as intravenous (IV) therapy or catheters. It is important that you learn how to decrease fall risks while at the hospital. Below are important tips that can help prevent falls. SAFETY TIPS FOR PREVENTING FALLS Talk about your risk of falling.  Ask your health care provider why you are at risk for falling. Is it your medicine, illness, tubing placement, or something else?  Make a plan with your health care provider to keep you safe from falls.  Ask your health care provider or pharmacist about side effects of your medicines. Some medicines can make you dizzy or affect your coordination. Ask for help.  Ask for help before getting out of bed. You may need to press your call button.  Ask for assistance in getting safely to the toilet.  Ask for a walker or cane to be put at your bedside. Ask that most of the side rails on your bed be placed up before your health care  provider leaves the room.  Ask family or friends to sit with you.  Ask for things that are out of your reach, such as your glasses, hearing aids, telephone, bedside table, or call button. Follow these tips to avoid falling:  Stay lying or seated, rather than standing, while waiting for help.  Wear rubber-soled slippers or shoes whenever you walk in the hospital.  Avoid quick, sudden movements.  Change positions slowly.  Sit on the side of your bed before standing.  Stand up slowly and wait before you start to walk.  Let your health care provider know if there is a spill on the floor.  Pay careful attention to the medical equipment, electrical cords, and tubes around you.  When you need help, use your call button by your bed or in the bathroom. Wait for one of your health care providers to help you.  If you feel dizzy or unsure of  your footing, return to bed and wait for assistance.  Avoid being distracted by the TV, telephone, or another person in your room.  Do not lean or support yourself on rolling objects, such as IV poles or bedside tables.   This information is not intended to replace advice given to you by your health care provider. Make sure you discuss any questions you have with your health care provider.   Document Released: 08/30/2000 Document Revised: 09/23/2014 Document Reviewed: 05/10/2012 Elsevier Interactive Patient Education [employer]

## 2015-10-11 NOTE — ED Provider Notes (Signed)
CSN: [id number]     Arrival date & time 10/11/15  0028 History  By signing my name below, I, Soijett Blue, attest that this documentation has been prepared under the direction and in the presence of Geoffery Lyons, MD. Electronically Signed: Soijett Blue, ED Scribe. 10/11/2015. 12:54 AM.   Chief Complaint  Patient presents with  . Fall  . Head Laceration    LEVEL 5 CAVEAT: DEMENTIA  The history is provided by the patient. No language interpreter was used.    Tanya Russell is a 82 y.o. female with a PMHx of alzheimer's, who presents to the Emergency Department via EMS complaining of a un-witnessed fall onset PTA. Pt notes that she rolled over and fell from her bed tonight at her nursing home. Pt is from [facility] and the nursing staff informed EMS that they are unsure if the pt had LOC due to her fall. Pt is having associated symptoms of laceration to forehead. She notes that she has not tried any medications for the relief of her symptoms. She denies any other symptoms.    History reviewed. No pertinent past medical history. History reviewed. No pertinent past surgical history. History reviewed. No pertinent family history. Social History  Substance Use Topics  . Smoking status: Never Smoker   . Smokeless tobacco: None  . Alcohol Use: No   OB History    No data available     Review of Systems  Unable to perform ROS: Dementia    Allergies  Review of patient's allergies indicates no known allergies.  Home Medications   Prior to Admission medications   Medication Sig Start Date End Date Taking? Authorizing Provider  aspirin 325 MG tablet Take 1 tablet (325 mg total) by mouth daily. 07/14/13   Belkys A Regalado, MD  carvedilol (COREG) 6.25 MG tablet Take 6.25 mg by mouth 2 (two) times daily with a meal.    Historical Provider, MD  digoxin (LANOXIN) 0.125 MG tablet Take 0.125 mg by mouth daily.    Historical Provider, MD  loratadine (CLARITIN) 10 MG tablet Take 10  mg by mouth daily.    Historical Provider, MD  mirabegron ER (MYRBETRIQ) 25 MG TB24 tablet Take 25 mg by mouth daily.    Historical Provider, MD  nitroGLYCERIN (NITROSTAT) 0.4 MG SL tablet Place 0.4 mg under the tongue every 5 (five) minutes as needed for chest pain.    Historical Provider, MD  OVER THE COUNTER MEDICATION Take 1 tablet by mouth daily. PreserVision    Historical Provider, MD  pantoprazole (PROTONIX) 40 MG tablet Take 40 mg by mouth daily.    Historical Provider, MD  Travoprost, BAK Free, (TRAVATAN) 0.004 % SOLN ophthalmic solution Place 1 drop into both eyes at bedtime.    Historical Provider, MD   BP 111/97 mmHg  Pulse 77  Temp(Src) 97.4 F (36.3 C) (Axillary)  Resp 17  SpO2 93% Physical Exam  Constitutional: She is oriented to person, place, and time. She appears well-developed and well-nourished. No distress.  HENT:  Head: Normocephalic. Head is with laceration.  Right Ear: Hearing normal.  Left Ear: Hearing normal.  Nose: Nose normal.  Mouth/Throat: Oropharynx is clear and moist and mucous membranes are normal.  2.5 cm laceration to the right upper forehead. Bleeding is controlled.   Eyes: Conjunctivae and EOM are normal. Pupils are equal, round, and reactive to light.  Neck: Normal range of motion. Neck supple.  No tenderness or step offs.   Cardiovascular: Normal rate,  regular rhythm, S1 normal, S2 normal and normal heart sounds.  Exam reveals no gallop and no friction rub.   No murmur heard. Pulmonary/Chest: Effort normal and breath sounds normal. No respiratory distress. She exhibits no tenderness.  Abdominal: Soft. Normal appearance and bowel sounds are normal. There is no hepatosplenomegaly. There is no tenderness. There is no rebound, no guarding, no tenderness at McBurney's point and negative Murphy's sign. No hernia.  Musculoskeletal: Normal range of motion.  The patient's pelvis is stable. She has good range of motion of the hip joints without any apparent  discomfort.  Neurological: She is alert and oriented to person, place, and time. She has normal strength. No cranial nerve deficit or sensory deficit. Coordination normal. GCS eye subscore is 4. GCS verbal subscore is 5. GCS motor subscore is 6.  Neuro exam is somewhat difficult secondary to dementia. She does move all four extremities.  Skin: Skin is warm, dry and intact. No rash noted. No cyanosis.  Psychiatric: She has a normal mood and affect. Her speech is normal and behavior is normal. Thought content normal.  Nursing note and vitals reviewed.   ED Course  Procedures (including critical care time) DIAGNOSTIC STUDIES: Oxygen Saturation is 93% on RA, low by my interpretation.    COORDINATION OF CARE: 12:45 AM Discussed treatment plan with pt at bedside which includes laceration repair and pt agreed to plan.  LACERATION REPAIR PROCEDURE NOTE The patient's identification was confirmed and consent was obtained. This procedure was performed by Geoffery Lyons, MD at 12:56 AM. Site: Forehead Sterile procedures observed: YES Anesthetic used (type and amt): 2 % Lidocaine with Epinephrine and 2 cc used Suture type/size:6-0 Prolene Length: 2.5 cm # of Sutures: 3 Technique:Simple interrupted Antibx ointment applied: Bacitracin Tetanus UTD or ordered: N/A Site anesthetized, irrigated with NS, explored without evidence of foreign body, wound well approximated, site covered with dry, sterile dressing.  Patient tolerated procedure well without complications. Instructions for care discussed verbally and patient provided with additional written instructions for homecare and f/u.   Labs Review Labs Reviewed - No data to display  Imaging Review No results found. I have personally reviewed and evaluated these images and lab results as part of my medical decision-making.   MDM   Final diagnoses:  None    Patient presents after a fall out of bed from a nursing home. She has a laceration  of the right upper forehead which was repaired as above. CT scan of the head and cervical spine are negative. She will be discharged with local wound care, suture removal in 5 days, and when necessary return for any problems.  I personally performed the services described in this documentation, which was scribed in my presence. The recorded information has been reviewed and is accurate.       Geoffery Lyons, MD 10/11/15 [phone]

## 2015-10-11 NOTE — ED Notes (Signed)
Bed: WA16 Expected date:  Expected time:  Means of arrival:  Comments: EMS 

## 2015-10-19 LAB — HEPATIC FUNCTION PANEL
ALT: 7 U/L (ref 7–35)
AST: 23 U/L (ref 13–35)
Alkaline Phosphatase: 85 U/L (ref 25–125)
BILIRUBIN, TOTAL: 1.1 mg/dL

## 2015-10-19 LAB — BASIC METABOLIC PANEL
BUN: 16 mg/dL (ref 4–21)
Creatinine: 0.5 mg/dL (ref 0.5–1.1)
GLUCOSE: 84 mg/dL
POTASSIUM: 3 mmol/L — AB (ref 3.4–5.3)
SODIUM: 136 mmol/L — AB (ref 137–147)

## 2015-10-19 LAB — LIPID PANEL
Cholesterol: 168 mg/dL (ref 0–200)
HDL: 49 mg/dL (ref 35–70)
LDL CALC: 109 mg/dL
Triglycerides: 63 mg/dL (ref 40–160)

## 2015-10-19 LAB — CBC AND DIFFERENTIAL
HEMATOCRIT: 33 % — AB (ref 36–46)
HEMOGLOBIN: 11.4 g/dL — AB (ref 12.0–16.0)
Platelets: 219 10*3/uL (ref 150–399)
WBC: 3.7 10^3/mL

## 2015-10-19 LAB — TSH: TSH: 2.1 u[IU]/mL (ref 0.41–5.90)

## 2015-10-19 LAB — HEMOGLOBIN A1C: Hemoglobin A1C: 5.5

## 2015-11-22 ENCOUNTER — Non-Acute Institutional Stay (SKILLED_NURSING_FACILITY): Payer: PPO | Admitting: Internal Medicine

## 2015-11-22 ENCOUNTER — Encounter: Payer: Self-pay | Admitting: Internal Medicine

## 2015-11-22 DIAGNOSIS — K219 Gastro-esophageal reflux disease without esophagitis: Secondary | ICD-10-CM | POA: Diagnosis not present

## 2015-11-22 DIAGNOSIS — I4891 Unspecified atrial fibrillation: Secondary | ICD-10-CM

## 2015-11-22 DIAGNOSIS — D638 Anemia in other chronic diseases classified elsewhere: Secondary | ICD-10-CM | POA: Insufficient documentation

## 2015-11-22 DIAGNOSIS — N3281 Overactive bladder: Secondary | ICD-10-CM | POA: Diagnosis not present

## 2015-11-22 DIAGNOSIS — G4700 Insomnia, unspecified: Secondary | ICD-10-CM | POA: Diagnosis not present

## 2015-11-22 DIAGNOSIS — E559 Vitamin D deficiency, unspecified: Secondary | ICD-10-CM

## 2015-11-22 DIAGNOSIS — H409 Unspecified glaucoma: Secondary | ICD-10-CM | POA: Diagnosis not present

## 2015-11-22 DIAGNOSIS — I509 Heart failure, unspecified: Secondary | ICD-10-CM | POA: Diagnosis not present

## 2015-11-22 NOTE — Progress Notes (Signed)
MRN: [id number] Name: Tanya Russell  Sex: female Age: 82 y.o. DOB: 04/23/1914  [facility] Level Of Care: SNF Provider: Merrilee SeashoreALEXANDER, Arnav Cregg D Emergency Contacts: Extended Emergency Contact Information Primary Emergency Contact: [name],[name] Address: [address]          [place],  [id number] Home Phone: [phone] Relation: None  Code Status:   Allergies: Review of patient's allergies indicates no known allergies.  Chief Complaint  Patient presents with  . New Admit To SNF    HPI: Patient is 82 y.o. female with CHF, AF, GERD, glaucoma who is being admitted to SNF for residential care from an ALF because she can no longer take care of herself and needs SNF  She came with some  information and  I was able to glean some from EPIC. While at SNF she will be followed for CHF, tx with digoxin and lasix, AF, tx with cardizem and digoxin and GERD , tx with protonix.   Past Medical History  Diagnosis Date  . GERD (gastroesophageal reflux disease)   . CHF (congestive heart failure) (HCC)   . Anemia   . Glaucoma   . Vitamin D deficiency   . Insomnia     No past surgical history on file.    Medication List       This list is accurate as of: 11/22/15 11:59 PM.  Always use your most recent med list.               AMBIEN 5 MG tablet  Generic drug:  zolpidem  Take 5 mg by mouth at bedtime.     aspirin 325 MG tablet  Take 1 tablet (325 mg total) by mouth daily.     BEN GAY GREASELESS 10-15 % greaseless cream  Apply 1 application topically every 6 (six) hours as needed for pain.     digoxin 0.125 MG tablet  Commonly known as:  LANOXIN  Take 0.125 mg by mouth daily.     diltiazem 30 MG tablet  Commonly known as:  CARDIZEM  Take 30 mg by mouth every 8 (eight) hours. Hold for heart rate greater than 60 or blood pressure grater than 100     furosemide 40 MG tablet  Commonly known as:  LASIX  Take 40 mg by mouth 2 (two) times daily.      ondansetron 4 MG disintegrating tablet  Commonly known as:  ZOFRAN-ODT  Take 4 mg by mouth every 8 (eight) hours as needed for nausea or vomiting.     pantoprazole 40 MG tablet  Commonly known as:  PROTONIX  Take 40 mg by mouth daily.     sertraline 25 MG tablet  Commonly known as:  ZOLOFT  Take 25 mg by mouth at bedtime.     solifenacin 10 MG tablet  Commonly known as:  VESICARE  Take 10 mg by mouth daily.     SYSTANE BALANCE OP  Place 1 drop into both eyes 2 (two) times daily.     Travoprost (BAK Free) 0.004 % Soln ophthalmic solution  Commonly known as:  TRAVATAN  Place 1 drop into both eyes daily.        No orders of the defined types were placed in this encounter.    Immunization History  Administered Date(s) Administered  . Influenza,inj,Quad PF,36+ Mos 07/14/2013    Social History  Substance Use Topics  . Smoking status: Never Smoker   . Smokeless tobacco: Not on file  . Alcohol  Use: No    Family history is UTO 2/2 pt dementia   Review of Systems  DATA OBTAINED: from patient, nurse, medical record GENERAL:  no fevers, fatigue, appetite changes, has had falls lately SKIN: No itching, rash or wounds EYES: No eye pain, redness, discharge EARS:doesn't hear well NOSE: No congestion, drainage or bleeding  MOUTH/THROAT: No mouth or tooth pain, No sore throat RESPIRATORY: No cough, wheezing, SOB CARDIAC: No chest pain, palpitations, lower extremity edema  GI: No abdominal pain, No N/V/D or constipation, No heartburn or reflux  GU: No dysuria, frequency or urgency, or incontinence  MUSCULOSKELETAL: No unrelieved bone/joint pain NEUROLOGIC: No headache, dizziness or focal weakness PSYCHIATRIC: No c/o anxiety or sadness   Filed Vitals:   11/26/15 2020  BP: 120/60  Pulse: 74  Temp: 97 F (36.1 C)  Resp: 16    SpO2 Readings from Last 1 Encounters:  10/11/15 93%        Physical Exam  GENERAL APPEARANCE: Alert, modconversant,  No acute distress.   SKIN: No diaphoresis rash HEAD: Normocephalic, atraumatic  EYES: Conjunctiva/lids clear. Pupils round, reactive. EOMs intact.  EARS:very HOH; have to scream next to eatr  NOSE: No deformity or discharge.  MOUTH/THROAT: Lips w/o lesions  RESPIRATORY: Breathing is even, unlabored. Lung sounds are clear   CARDIOVASCULAR: Heart RRR no murmurs, rubs or gallops. No peripheral edema.   GASTROINTESTINAL: Abdomen is soft, non-tender, not distended w/ normal bowel sounds. GENITOURINARY: Bladder non tender, not distended  MUSCULOSKELETAL: No abnormal joints or musculature NEUROLOGIC:  Cranial nerves 2-12 grossly intact. Moves all extremities  PSYCHIATRIC: dementia, no behavioral issues  Patient Active Problem List   Diagnosis Date Noted  . Overactive bladder 11/26/2015  . Atrial fibrillation (HCC) 11/26/2015  . Glaucoma 11/22/2015  . CHF (congestive heart failure) (HCC) 11/22/2015  . Anemia of chronic disease 11/22/2015  . Vitamin D deficiency   . Insomnia   . Atrial fibrillation with rapid ventricular response (HCC) 07/13/2013  . Chest pain, unspecified 07/13/2013  . Hyponatremia 07/13/2013  . GERD (gastroesophageal reflux disease) 07/13/2013    CBC    Component Value Date/Time   WBC 3.7 10/19/2015   WBC 2.6* 07/14/2013 0555   RBC 3.06* 07/14/2013 0555   HGB 11.4* 10/19/2015   HCT 33* 10/19/2015   PLT 219 10/19/2015   MCV 92.5 07/14/2013 0555   LYMPHSABS 1.3 07/13/2013 0342   MONOABS 0.8 07/13/2013 0342   EOSABS 0.1 07/13/2013 0342   BASOSABS 0.0 07/13/2013 0342    CMP     Component Value Date/Time   NA 136* 10/19/2015   NA 135 07/14/2013 0555   K 3.0* 10/19/2015   CL 104 07/14/2013 0555   CO2 26 07/14/2013 0555   GLUCOSE 83 07/14/2013 0555   BUN 16 10/19/2015   BUN 9 07/14/2013 0555   CREATININE 0.5 10/19/2015   CREATININE 0.50 07/14/2013 0555   CALCIUM 8.3* 07/14/2013 0555   AST 23 10/19/2015   ALT 7 10/19/2015   ALKPHOS 85 10/19/2015   GFRNONAA 79* 07/14/2013  0555   GFRAA >90 07/14/2013 0555    Lab Results  Component Value Date   HGBA1C 5.5 10/19/2015   10/19/2015 B12 - 493    Vit D   14.3   Ct Head Wo Contrast  10/11/2015  CLINICAL DATA:  Rolled out of bed, with laceration at the forehead. Concern for head or cervical spine injury. Initial encounter. EXAM: CT HEAD WITHOUT CONTRAST CT CERVICAL SPINE WITHOUT CONTRAST TECHNIQUE: Multidetector CT imaging of  the head and cervical spine was performed following the standard protocol without intravenous contrast. Multiplanar CT image reconstructions of the cervical spine were also generated. COMPARISON:  CT of the head performed 12/17/2008 FINDINGS: CT HEAD FINDINGS There is no evidence of acute infarction, mass lesion, or intra- or extra-axial hemorrhage on CT. Prominence of the ventricles and sulci reflects moderate cortical volume loss. Cerebellar atrophy is noted. Scattered periventricular and subcortical white matter change likely reflects small vessel ischemic microangiopathy. The brainstem and fourth ventricle are within normal limits. The basal ganglia are unremarkable in appearance. The cerebral hemispheres demonstrate grossly normal gray-white differentiation. No mass effect or midline shift is seen. There is no evidence of fracture; visualized osseous structures are unremarkable in appearance. The visualized portions of the orbits are within normal limits. There appears to be chronic opacification of the maxillary sinuses and nasal passages bilaterally, with associated periosteal thickening. The patient is status post left-sided maxillary antrectomy. There is partial opacification of the ethmoid air cells, and mild mucosal thickening at the sphenoid sinus. The mastoid air cells are well-aerated. No significant soft tissue abnormalities are seen. CT CERVICAL SPINE FINDINGS There is no evidence of acute fracture or subluxation. There is grade 1 anterolisthesis of C4 on C5, and mild grade 1 anterolisthesis of C5  on C6. Mild underlying facet disease is noted. Vertebral bodies demonstrate normal height. Multilevel disc space narrowing is noted along the lower cervical spine. The thyroid gland is unremarkable in appearance. The visualized lung apices are clear. No significant soft tissue abnormalities are seen. IMPRESSION: 1. No evidence of traumatic intracranial injury or fracture. 2. No evidence of acute fracture or subluxation along the cervical spine. 3. Moderate cortical volume loss and scattered small vessel ischemic microangiopathy. 4. Mild degenerative change along the lower cervical spine. 5. Chronic opacification of the maxillary sinuses and nasal passages, with associated periosteal thickening. Status post left-sided maxillary antrectomy. Mild mucosal thickening at the sphenoid sinus. Electronically Signed   By: Roanna Raider M.D.   On: 10/11/2015 01:40   Ct Cervical Spine Wo Contrast  10/11/2015  CLINICAL DATA:  Rolled out of bed, with laceration at the forehead. Concern for head or cervical spine injury. Initial encounter. EXAM: CT HEAD WITHOUT CONTRAST CT CERVICAL SPINE WITHOUT CONTRAST TECHNIQUE: Multidetector CT imaging of the head and cervical spine was performed following the standard protocol without intravenous contrast. Multiplanar CT image reconstructions of the cervical spine were also generated. COMPARISON:  CT of the head performed 12/17/2008 FINDINGS: CT HEAD FINDINGS There is no evidence of acute infarction, mass lesion, or intra- or extra-axial hemorrhage on CT. Prominence of the ventricles and sulci reflects moderate cortical volume loss. Cerebellar atrophy is noted. Scattered periventricular and subcortical white matter change likely reflects small vessel ischemic microangiopathy. The brainstem and fourth ventricle are within normal limits. The basal ganglia are unremarkable in appearance. The cerebral hemispheres demonstrate grossly normal gray-white differentiation. No mass effect or midline  shift is seen. There is no evidence of fracture; visualized osseous structures are unremarkable in appearance. The visualized portions of the orbits are within normal limits. There appears to be chronic opacification of the maxillary sinuses and nasal passages bilaterally, with associated periosteal thickening. The patient is status post left-sided maxillary antrectomy. There is partial opacification of the ethmoid air cells, and mild mucosal thickening at the sphenoid sinus. The mastoid air cells are well-aerated. No significant soft tissue abnormalities are seen. CT CERVICAL SPINE FINDINGS There is no evidence of acute fracture or subluxation.  There is grade 1 anterolisthesis of C4 on C5, and mild grade 1 anterolisthesis of C5 on C6. Mild underlying facet disease is noted. Vertebral bodies demonstrate normal height. Multilevel disc space narrowing is noted along the lower cervical spine. The thyroid gland is unremarkable in appearance. The visualized lung apices are clear. No significant soft tissue abnormalities are seen. IMPRESSION: 1. No evidence of traumatic intracranial injury or fracture. 2. No evidence of acute fracture or subluxation along the cervical spine. 3. Moderate cortical volume loss and scattered small vessel ischemic microangiopathy. 4. Mild degenerative change along the lower cervical spine. 5. Chronic opacification of the maxillary sinuses and nasal passages, with associated periosteal thickening. Status post left-sided maxillary antrectomy. Mild mucosal thickening at the sphenoid sinus. Electronically Signed   By: Roanna Raider M.D.   On: 10/11/2015 01:40    Not all labs, radiology exams or other studies done during hospitalization come through on my EPIC note; however they are reviewed by me.    Assessment and Plan  CHF (congestive heart failure) (HCC) Appears to be stable at this time. Is on digoxin , dlitiazem and lasix. Will continue the same  Atrial fibrillation with rapid  ventricular response Pt on digoxin and cardizem, with ASA 325 as prophylaxis;will cont same  GERD (gastroesophageal reflux disease) Appears to be stable;cont protonix 40 mg   Overactive bladder Appears stable;will continue vesicare  Vitamin D deficiency Appears to be a failry new dx; will conr the 2000 u daily pt is getting now  Insomnia It appears pt has ambien for insomnia; will continue  Glaucoma Pt is on systane and tavatan drops;will continue  Anemia of chronic disease Hb in 10/2015 was 11.;will monitor at intervals.   Time spent > 45 min;> 50% of time with patient was spent reviewing records, labs, tests and studies, counseling and developing plan of care  Margit Hanks, MD

## 2015-11-26 ENCOUNTER — Encounter: Payer: Self-pay | Admitting: Internal Medicine

## 2015-11-26 DIAGNOSIS — I4891 Unspecified atrial fibrillation: Secondary | ICD-10-CM | POA: Insufficient documentation

## 2015-11-26 DIAGNOSIS — N3281 Overactive bladder: Secondary | ICD-10-CM | POA: Insufficient documentation

## 2015-11-26 NOTE — Assessment & Plan Note (Signed)
It appears pt has ambien for insomnia; will continue

## 2015-11-26 NOTE — Assessment & Plan Note (Signed)
Appears to be a failry new dx; will conr the 2000 u daily pt is getting now

## 2015-11-26 NOTE — Assessment & Plan Note (Signed)
Hb in 10/2015 was 11.;will monitor at intervals.

## 2015-11-26 NOTE — Assessment & Plan Note (Signed)
Appears stable;will continue vesicare

## 2015-11-26 NOTE — Assessment & Plan Note (Signed)
Appears to be stable;cont protonix 40 mg

## 2015-11-26 NOTE — Assessment & Plan Note (Signed)
Pt is on systane and tavatan drops;will continue

## 2015-11-26 NOTE — Assessment & Plan Note (Signed)
Pt on digoxin and cardizem, with ASA 325 as prophylaxis;will cont same

## 2015-11-26 NOTE — Assessment & Plan Note (Addendum)
Appears to be stable at this time. Is on digoxin , dlitiazem and lasix. Will continue the same

## 2015-12-06 ENCOUNTER — Encounter: Payer: Self-pay | Admitting: Internal Medicine

## 2015-12-06 ENCOUNTER — Non-Acute Institutional Stay (SKILLED_NURSING_FACILITY): Payer: PPO | Admitting: Internal Medicine

## 2015-12-06 DIAGNOSIS — E876 Hypokalemia: Secondary | ICD-10-CM | POA: Diagnosis not present

## 2015-12-06 DIAGNOSIS — I509 Heart failure, unspecified: Secondary | ICD-10-CM

## 2015-12-06 DIAGNOSIS — R634 Abnormal weight loss: Secondary | ICD-10-CM

## 2015-12-06 LAB — BASIC METABOLIC PANEL
BUN: 16 mg/dL (ref 4–21)
Creatinine: 0.6 mg/dL (ref 0.5–1.1)
GLUCOSE: 103 mg/dL
POTASSIUM: 3.2 mmol/L — AB (ref 3.4–5.3)
Sodium: 142 mmol/L (ref 137–147)

## 2015-12-06 LAB — HEMOGLOBIN A1C: Hemoglobin A1C: 5.1

## 2015-12-10 DIAGNOSIS — E876 Hypokalemia: Secondary | ICD-10-CM | POA: Insufficient documentation

## 2015-12-10 DIAGNOSIS — R634 Abnormal weight loss: Secondary | ICD-10-CM | POA: Insufficient documentation

## 2015-12-10 NOTE — Progress Notes (Signed)
Patient ID: Tanya Russell, female   DOB: [birth year], 82 y.o.   MRN: [id number] MRN: [id number] Name: Tanya Russell  Sex: female Age: 82 y.o. DOB: 02-Aug-1914  [facility] #: [facility] Facility/Room:208 Level Of Care: SNF Provider: Roena Malady Emergency Contacts: Extended Emergency Contact Information Primary Emergency Contact: [name],[name] Address: [address]          [place],  [address] Home Phone: [phone] Relation: None  Code Status:   Allergies: Review of patient's allergies indicates no known allergies.  Chief Complaint  Patient presents with  . Acute Visit    Hypokalemia; Weight loss    HPI: Patient is 82 y.o. female with CHF, AF, GERD, glaucoma who was admitted to SNF for residential care from an ALF because she can no longer take care of herself and needs SNF  . While at SNF she will be followed for CHF, tx with digoxin and lasix, AF, tx with cardizem and digoxin and GERD , tx with protonix Were tied tear she's lost about 7 pounds since remission here-apparently is not eating very well-also note on lab done yesterday she has mild hypokalemia with potassium of 3.2 she is on potassium 10 mEq a day--she does have history CHF is on Lasix 40 mg a day as well. Nursing staff does not report any other issues apparently she ihas adapted fairly well with supportive care at the facility   Past Medical History  Diagnosis Date  . GERD (gastroesophageal reflux disease)   . CHF (congestive heart failure) (HCC)   . Anemia   . Glaucoma   . Vitamin D deficiency   . Insomnia     History reviewed. No pertinent past surgical history.    Medication List       This list is accurate as of: 12/06/15 11:59 PM.  Always use your most recent med list.               AMBIEN 5 MG tablet  Generic drug:  zolpidem  Take 5 mg by mouth at bedtime.     aspirin 325 MG tablet  Take 1 tablet (325 mg total) by mouth daily.     BEN GAY GREASELESS 10-15 % greaseless cream  Apply 1  application topically every 6 (six) hours as needed for pain.     digoxin 0.125 MG tablet  Commonly known as:  LANOXIN  Take 0.125 mg by mouth daily.     diltiazem 30 MG tablet  Commonly known as:  CARDIZEM  Take 30 mg by mouth every 8 (eight) hours. Hold for heart rate greater than 60 or blood pressure grater than 100     furosemide 40 MG tablet  Commonly known as:  LASIX  Take 40 mg by mouth 2 (two) times daily.     ondansetron 4 MG disintegrating tablet  Commonly known as:  ZOFRAN-ODT  Take 4 mg by mouth every 8 (eight) hours as needed for nausea or vomiting.     pantoprazole 40 MG tablet  Commonly known as:  PROTONIX  Take 40 mg by mouth daily.     potassium chloride 10 MEQ tablet  Commonly known as:  K-DUR  Take 10 mEq by mouth daily.     sertraline 25 MG tablet  Commonly known as:  ZOLOFT  Take 25 mg by mouth at bedtime.     solifenacin 10 MG tablet  Commonly known as:  VESICARE  Take 10 mg by mouth daily.     SYSTANE BALANCE  OP  Place 1 drop into both eyes 2 (two) times daily.     Travoprost (BAK Free) 0.004 % Soln ophthalmic solution  Commonly known as:  TRAVATAN  Place 1 drop into both eyes daily.     Vitamin D (Cholecalciferol) 1000 units Caps  Take 2 capsules by mouth daily.        Meds ordered this encounter  Medications  . potassium chloride (K-DUR) 10 MEQ tablet    Sig: Take 10 mEq by mouth daily.  . Vitamin D, Cholecalciferol, 1000 units CAPS    Sig: Take 2 capsules by mouth daily.    Immunization History  Administered Date(s) Administered  . Influenza,inj,Quad PF,36+ Mos 07/14/2013  . PPD Test 11/22/2015    Social History  Substance Use Topics  . Smoking status: Never Smoker   . Smokeless tobacco: Not on file  . Alcohol Use: No    Family history is UTO 2/2 pt dementia   Review of Systems  DATA OBTAINED: from patient, nurse, medical record GENERAL:  no fevers, fatigue, Apparently poor appetite at times SKIN: No itching, rash or  wounds EYES: No eye pain, redness, discharge EARS:doesn't hear well NOSE: No congestion, drainage or bleeding  MOUTH/THROAT: No mouth or tooth pain, No sore throat RESPIRATORY: No cough, wheezing, SOB CARDIAC: No chest pain, palpitations, lower extremity edema  GI: No abdominal pain, No N/V/D or constipation, No heartburn or reflux  GU: No dysuria, frequency or urgency, or incontinence  MUSCULOSKELETAL: No unrelieved bone/joint pain NEUROLOGIC: No headache, dizziness or focal weakness PSYCHIATRIC: No c/o anxiety or sadness   Filed Vitals:   12/06/15 1212  BP: 97/66  Pulse: 77  Temp: 98.4 F (36.9 C)  Resp: 16    SpO2 Readings from Last 1 Encounters:  10/11/15 93%        Physical Exam  GENERAL APPEARANCE: Alert, modconversant,  No acute distress.  SKIN: No diaphoresis rashHas what appears to be some resolving bruising to her fore head there is no sign of infection apparently this is status post fall HEAD: Normocephalic, atraumatic  EYES: Conjunctiva/lids clear. Pupils round, reactive. EOMs intact.  EARS:very HOH;  NOSE: No deformity or discharge.  MOUTH/THROAT: Lips w/o lesions  RESPIRATORY: Breathing is even, unlabored. Lung sounds are clear there is poor respiratory effort  CARDIOVASCULAR: Heart RRR with some irregular beats no murmurs, rubs or gallops. No peripheral edema.   GASTROINTESTINAL: Abdomen is soft, non-tender, not distended w/ normal bowel sounds. GENITOURINARY: Bladder non tender, not distended  MUSCULOSKELETAL: No abnormal joints or musculature NEUROLOGIC:  Cranial nerves 2-12 grossly intact. Moves all extremities  PSYCHIATRIC: dementia, no behavioral issues  Patient Active Problem List   Diagnosis Date Noted  . Overactive bladder 11/26/2015  . Atrial fibrillation (HCC) 11/26/2015  . Glaucoma 11/22/2015  . CHF (congestive heart failure) (HCC) 11/22/2015  . Anemia of chronic disease 11/22/2015  . Vitamin D deficiency   . Insomnia   . Atrial  fibrillation with rapid ventricular response (HCC) 07/13/2013  . Chest pain, unspecified 07/13/2013  . Hyponatremia 07/13/2013  . GERD (gastroesophageal reflux disease) 07/13/2013    Labs.  12/05/2015.  Hemoglobin A1c 5.1.  Sodium 142 potassium 3.2 CO2 36-BUN 16-creatinine 0.59  CBC    Component Value Date/Time   WBC 3.7 10/19/2015   WBC 2.6* 07/14/2013 0555   RBC 3.06* 07/14/2013 0555   HGB 11.4* 10/19/2015   HCT 33* 10/19/2015   PLT 219 10/19/2015   MCV 92.5 07/14/2013 0555   LYMPHSABS 1.3 07/13/2013 0342  MONOABS 0.8 07/13/2013 0342   EOSABS 0.1 07/13/2013 0342   BASOSABS 0.0 07/13/2013 0342    CMP     Component Value Date/Time   NA 142 12/06/2015   NA 135 07/14/2013 0555   K 3.2* 12/06/2015   CL 104 07/14/2013 0555   CO2 26 07/14/2013 0555   GLUCOSE 83 07/14/2013 0555   BUN 16 12/06/2015   BUN 9 07/14/2013 0555   CREATININE 0.6 12/06/2015   CREATININE 0.50 07/14/2013 0555   CALCIUM 8.3* 07/14/2013 0555   AST 23 10/19/2015   ALT 7 10/19/2015   ALKPHOS 85 10/19/2015   GFRNONAA 79* 07/14/2013 0555   GFRAA >90 07/14/2013 0555    Lab Results  Component Value Date   HGBA1C 5.1 12/06/2015   10/19/2015 B12 - 493    Vit D   14.3   Ct Head Wo Contrast  10/11/2015  CLINICAL DATA:  Rolled out of bed, with laceration at the forehead. Concern for head or cervical spine injury. Initial encounter. EXAM: CT HEAD WITHOUT CONTRAST CT CERVICAL SPINE WITHOUT CONTRAST TECHNIQUE: Multidetector CT imaging of the head and cervical spine was performed following the standard protocol without intravenous contrast. Multiplanar CT image reconstructions of the cervical spine were also generated. COMPARISON:  CT of the head performed 12/17/2008 FINDINGS: CT HEAD FINDINGS There is no evidence of acute infarction, mass lesion, or intra- or extra-axial hemorrhage on CT. Prominence of the ventricles and sulci reflects moderate cortical volume loss. Cerebellar atrophy is noted. Scattered  periventricular and subcortical white matter change likely reflects small vessel ischemic microangiopathy. The brainstem and fourth ventricle are within normal limits. The basal ganglia are unremarkable in appearance. The cerebral hemispheres demonstrate grossly normal gray-white differentiation. No mass effect or midline shift is seen. There is no evidence of fracture; visualized osseous structures are unremarkable in appearance. The visualized portions of the orbits are within normal limits. There appears to be chronic opacification of the maxillary sinuses and nasal passages bilaterally, with associated periosteal thickening. The patient is status post left-sided maxillary antrectomy. There is partial opacification of the ethmoid air cells, and mild mucosal thickening at the sphenoid sinus. The mastoid air cells are well-aerated. No significant soft tissue abnormalities are seen. CT CERVICAL SPINE FINDINGS There is no evidence of acute fracture or subluxation. There is grade 1 anterolisthesis of C4 on C5, and mild grade 1 anterolisthesis of C5 on C6. Mild underlying facet disease is noted. Vertebral bodies demonstrate normal height. Multilevel disc space narrowing is noted along the lower cervical spine. The thyroid gland is unremarkable in appearance. The visualized lung apices are clear. No significant soft tissue abnormalities are seen. IMPRESSION: 1. No evidence of traumatic intracranial injury or fracture. 2. No evidence of acute fracture or subluxation along the cervical spine. 3. Moderate cortical volume loss and scattered small vessel ischemic microangiopathy. 4. Mild degenerative change along the lower cervical spine. 5. Chronic opacification of the maxillary sinuses and nasal passages, with associated periosteal thickening. Status post left-sided maxillary antrectomy. Mild mucosal thickening at the sphenoid sinus. Electronically Signed   By: Roanna RaiderJeffery  Chang M.D.   On: 10/11/2015 01:40   Ct Cervical  Spine Wo Contrast  10/11/2015  CLINICAL DATA:  Rolled out of bed, with laceration at the forehead. Concern for head or cervical spine injury. Initial encounter. EXAM: CT HEAD WITHOUT CONTRAST CT CERVICAL SPINE WITHOUT CONTRAST TECHNIQUE: Multidetector CT imaging of the head and cervical spine was performed following the standard protocol without intravenous contrast. Multiplanar CT  image reconstructions of the cervical spine were also generated. COMPARISON:  CT of the head performed 12/17/2008 FINDINGS: CT HEAD FINDINGS There is no evidence of acute infarction, mass lesion, or intra- or extra-axial hemorrhage on CT. Prominence of the ventricles and sulci reflects moderate cortical volume loss. Cerebellar atrophy is noted. Scattered periventricular and subcortical white matter change likely reflects small vessel ischemic microangiopathy. The brainstem and fourth ventricle are within normal limits. The basal ganglia are unremarkable in appearance. The cerebral hemispheres demonstrate grossly normal gray-white differentiation. No mass effect or midline shift is seen. There is no evidence of fracture; visualized osseous structures are unremarkable in appearance. The visualized portions of the orbits are within normal limits. There appears to be chronic opacification of the maxillary sinuses and nasal passages bilaterally, with associated periosteal thickening. The patient is status post left-sided maxillary antrectomy. There is partial opacification of the ethmoid air cells, and mild mucosal thickening at the sphenoid sinus. The mastoid air cells are well-aerated. No significant soft tissue abnormalities are seen. CT CERVICAL SPINE FINDINGS There is no evidence of acute fracture or subluxation. There is grade 1 anterolisthesis of C4 on C5, and mild grade 1 anterolisthesis of C5 on C6. Mild underlying facet disease is noted. Vertebral bodies demonstrate normal height. Multilevel disc space narrowing is noted along the  lower cervical spine. The thyroid gland is unremarkable in appearance. The visualized lung apices are clear. No significant soft tissue abnormalities are seen. IMPRESSION: 1. No evidence of traumatic intracranial injury or fracture. 2. No evidence of acute fracture or subluxation along the cervical spine. 3. Moderate cortical volume loss and scattered small vessel ischemic microangiopathy. 4. Mild degenerative change along the lower cervical spine. 5. Chronic opacification of the maxillary sinuses and nasal passages, with associated periosteal thickening. Status post left-sided maxillary antrectomy. Mild mucosal thickening at the sphenoid sinus. Electronically Signed   By: Roanna Raider M.D.   On: 10/11/2015 01:40    Not all labs, radiology exams or other studies done during hospitalization come through on my EPIC note; however they are reviewed by me.    Assessment and Plan  Weight loss-I did discuss this with the dietitian who has recommended possibly starting an appetite stimulant-I did discuss this with her son via phone--he is willing to try this-again I did explain that success is questionable at times it works for summer residence and not for others-Will start low-dose Remeron 7.5 mg daily and monitor.  #2-history of mild hypokalemia will increase potassium up to 30 mEq a day for 2 days and then reduce to 20 mEq a day on day 3-update a BMP first lab [facility] week as well as in approximately 10 days.  #3-CHF she continues on Lasix with potassium supplementation as well as diltiazem and digoxin-at this point appears to be stable  [id number]    Charlesetta Milliron C,

## 2015-12-16 DEATH — deceased

## 2016-09-29 IMAGING — CT CT CERVICAL SPINE W/O CM
2 of 8 series · 8 of 27 positions shown, 10 images · non-contrast
Comparison: CT of the head performed 12/17/2008

CLINICAL DATA: Rolled out of bed, with laceration at the forehead.
Concern for head or cervical spine injury. Initial encounter.

EXAM:
CT HEAD WITHOUT CONTRAST
CT CERVICAL SPINE WITHOUT CONTRAST
TECHNIQUE: Multidetector CT imaging of the head and cervical spine was
performed following the standard protocol without intravenous
contrast. Multiplanar CT image reconstructions of the cervical spine
were also generated.

[Series 4: c-spine st · axial · 0.33mm/px · z∈[-196,-120]mm · 3 of 76 slices shown, 4 images]
[im 19/76  soft-tissue]
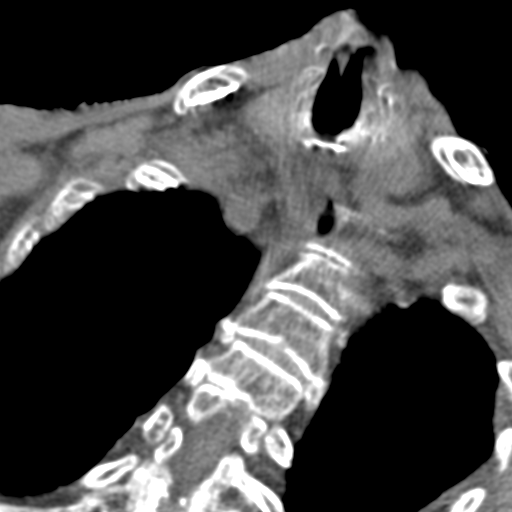
[im 19/76  bone]
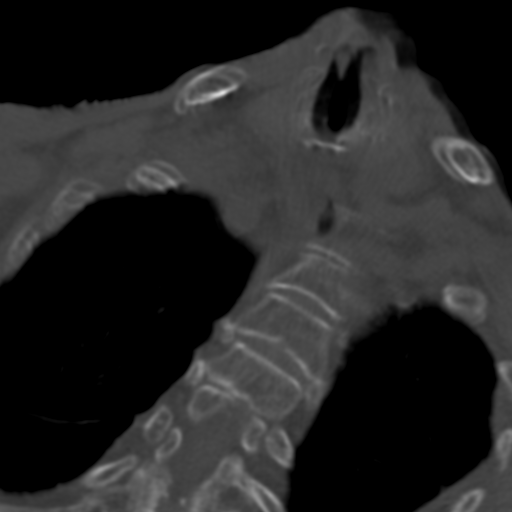
[im 38/76  bone]
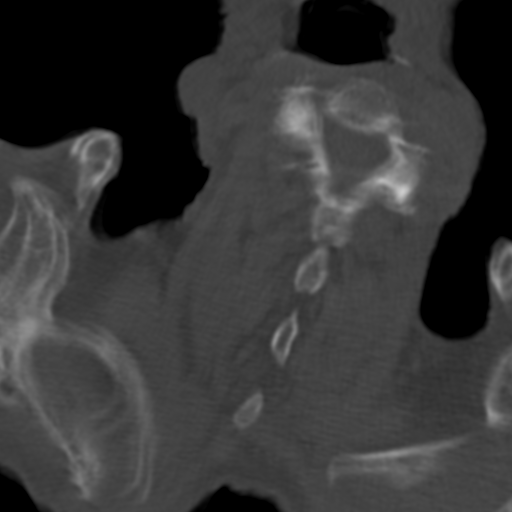
[im 57/76  bone]
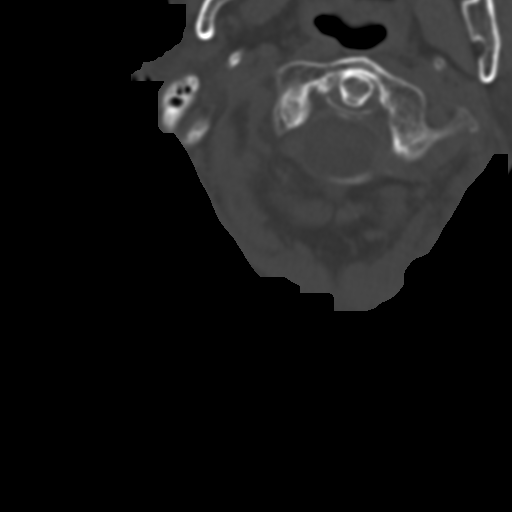

[Series 10: sagittal · sagittal · 0.21mm/px · 5 of 41 slices shown, 6 images]
[im 14/41  bone]
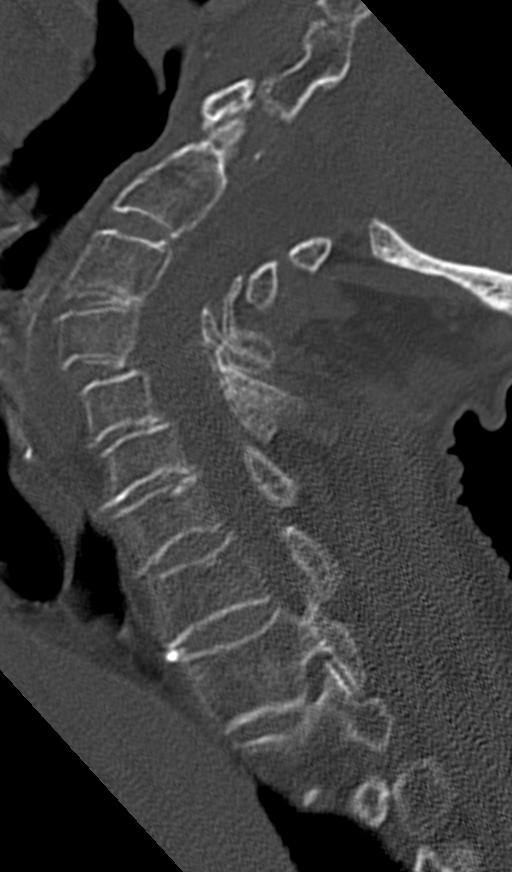
[im 17/41  bone]
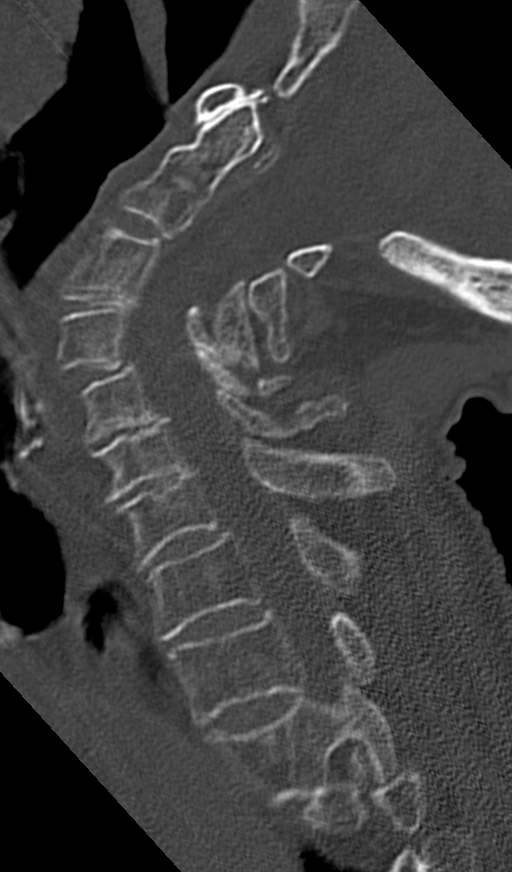
[im 21/41  soft-tissue]
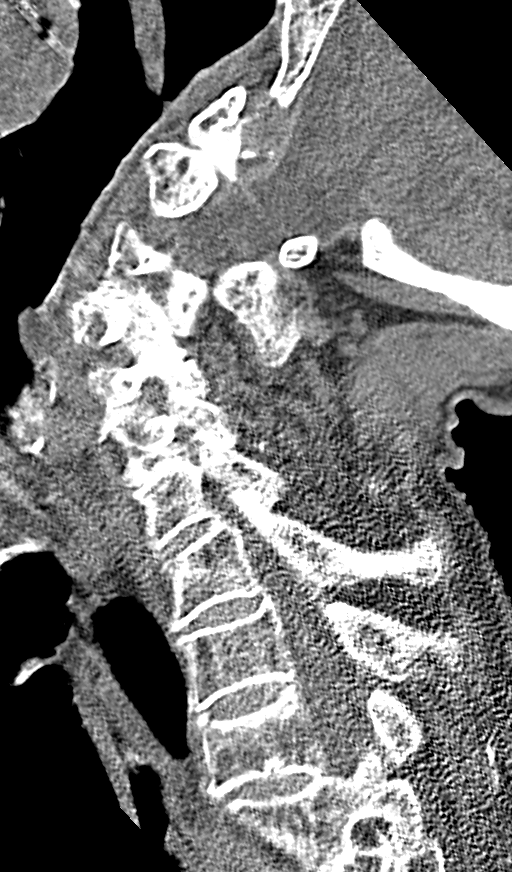
[im 21/41  bone]
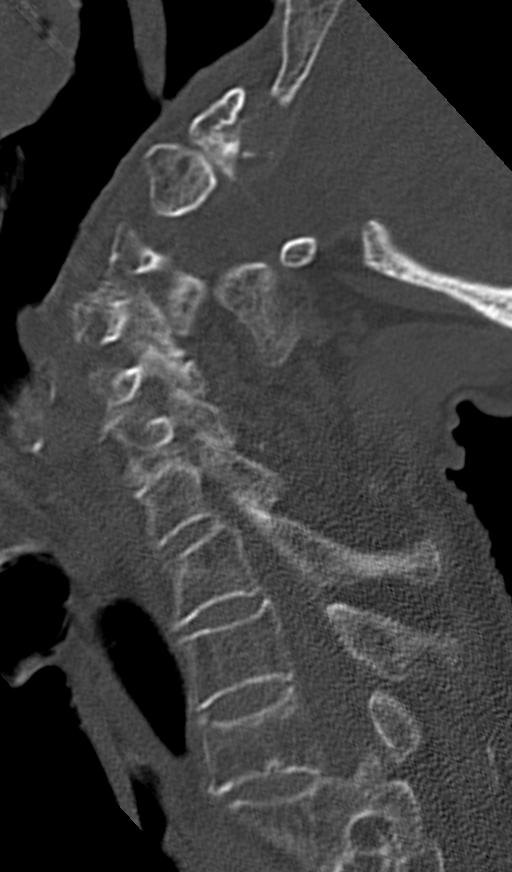
[im 24/41  bone]
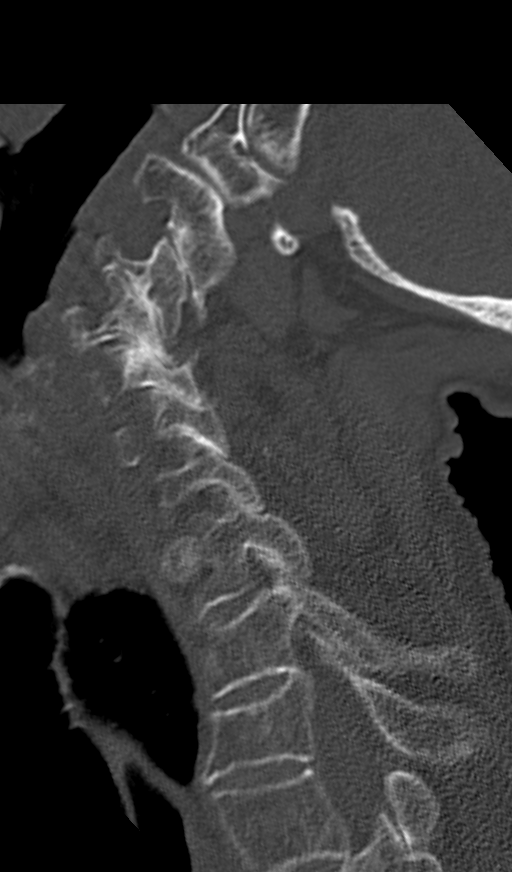
[im 27/41  bone]
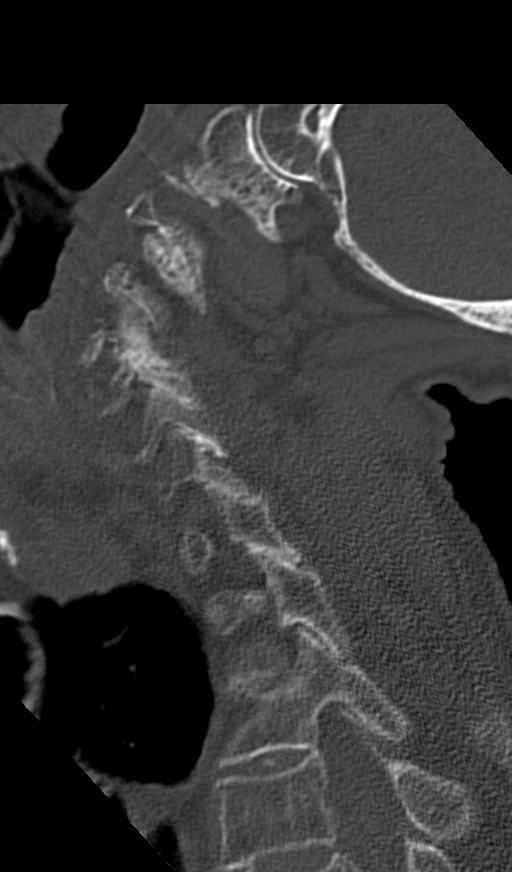

[8 of 27 positions shown; findings below may reference images not displayed]

FINDINGS: CT HEAD FINDINGS

There is no evidence of acute infarction, mass lesion, or intra- or
extra-axial hemorrhage on CT.

Prominence of the ventricles and sulci reflects moderate cortical
volume loss. Cerebellar atrophy is noted. Scattered periventricular
and subcortical white matter change likely reflects small vessel
ischemic microangiopathy.

The brainstem and fourth ventricle are within normal limits. The
basal ganglia are unremarkable in appearance. The cerebral
hemispheres demonstrate grossly normal gray-white differentiation.
No mass effect or midline shift is seen.

There is no evidence of fracture; visualized osseous structures are
unremarkable in appearance. The visualized portions of the orbits
are within normal limits. There appears to be chronic opacification
of the maxillary sinuses and nasal passages bilaterally, with
associated periosteal thickening. The patient is status post
left-sided maxillary antrectomy. There is partial opacification of
the ethmoid air cells, and mild mucosal thickening at the sphenoid
sinus. The mastoid air cells are well-aerated. No significant soft
tissue abnormalities are seen.

CT CERVICAL SPINE FINDINGS

There is no evidence of acute fracture or subluxation. There is
grade 1 anterolisthesis of C4 on C5, and mild grade 1
anterolisthesis of C5 on C6. Mild underlying facet disease is noted.
Vertebral bodies demonstrate normal height. Multilevel disc space
narrowing is noted along the lower cervical spine.

The thyroid gland is unremarkable in appearance. The visualized lung
apices are clear. No significant soft tissue abnormalities are seen.
IMPRESSION: 1. No evidence of traumatic intracranial injury or fracture.
2. No evidence of acute fracture or subluxation along the cervical
spine.
3. Moderate cortical volume loss and scattered small vessel ischemic
microangiopathy.
4. Mild degenerative change along the lower cervical spine.
5. Chronic opacification of the maxillary sinuses and nasal
passages, with associated periosteal thickening. Status post
left-sided maxillary antrectomy. Mild mucosal thickening at the
sphenoid sinus.
